# Patient Record
Sex: Female | Born: 1995 | Race: White | Hispanic: Yes | Marital: Single | Smoking: Never smoker
Health system: Southern US, Community
[De-identification: ages and names within clinical notes are randomized; demographics above are authoritative.]

## PROBLEM LIST (undated history)

## (undated) DIAGNOSIS — G43909 Migraine, unspecified, not intractable, without status migrainosus: Secondary | ICD-10-CM

---

## 2013-11-19 ENCOUNTER — Encounter (HOSPITAL_COMMUNITY): Payer: Self-pay | Admitting: Emergency Medicine

## 2013-11-19 ENCOUNTER — Emergency Department (HOSPITAL_COMMUNITY): Payer: BC Managed Care – PPO

## 2013-11-19 ENCOUNTER — Emergency Department (HOSPITAL_COMMUNITY)
Admission: EM | Admit: 2013-11-19 | Discharge: 2013-11-19 | Disposition: A | Discharge status: Home / Self Care | Payer: BC Managed Care – PPO | Attending: Emergency Medicine | Admitting: Emergency Medicine

## 2013-11-19 DIAGNOSIS — Y9239 Other specified sports and athletic area as the place of occurrence of the external cause: Secondary | ICD-10-CM | POA: Insufficient documentation

## 2013-11-19 DIAGNOSIS — S0993XA Unspecified injury of face, initial encounter: Secondary | ICD-10-CM | POA: Insufficient documentation

## 2013-11-19 DIAGNOSIS — Y9352 Activity, horseback riding: Secondary | ICD-10-CM | POA: Insufficient documentation

## 2013-11-19 DIAGNOSIS — R110 Nausea: Secondary | ICD-10-CM | POA: Insufficient documentation

## 2013-11-19 DIAGNOSIS — S060X0A Concussion without loss of consciousness, initial encounter: Secondary | ICD-10-CM | POA: Insufficient documentation

## 2013-11-19 DIAGNOSIS — S199XXA Unspecified injury of neck, initial encounter: Secondary | ICD-10-CM

## 2013-11-19 DIAGNOSIS — Z8679 Personal history of other diseases of the circulatory system: Secondary | ICD-10-CM | POA: Insufficient documentation

## 2013-11-19 DIAGNOSIS — Y92838 Other recreation area as the place of occurrence of the external cause: Secondary | ICD-10-CM

## 2013-11-19 HISTORY — DX: Migraine, unspecified, not intractable, without status migrainosus: G43.909

## 2013-11-19 MED ORDER — ONDANSETRON HCL 8 MG PO TABS: 8.0000 mg | ORAL_TABLET | Freq: Three times a day (TID) | ORAL | Status: AC | PRN

## 2013-11-19 NOTE — ED Notes (Signed)
Bed: [id number] Expected date:  Expected time:  Means of arrival:  Comments: Save for hall pt.

## 2013-11-19 NOTE — ED Notes (Signed)
Pt alert and oriented upon discharge. Female friend at the bedside. Pt laughing and joking thourgh out discharge instructions. PT verbalizes need for follow up with Neurology referrals that were provided to the patient.

## 2013-11-19 NOTE — ED Notes (Signed)
Pt was very tearful in triage.  This Clinical research associatewriter tried to reassure patient that we would be taken care of.  Pt responded, "Don't tell me that.  You don't fucking understand."  When bringing patient back to a bed, I explained that she needed to start in a hall bed to be moved to a room when someone was discharged.  Pt became very angry with this Clinical research associatewriter and said that she was going to go to another hospital.  I explained that if she left here, she would likely be waiting hours to get back to a bed.  Pt stated "I don't fucking care.  I'm leaving".  I told the patient that she could do as she pleased but I suggest she stay and be seen.  Pt responded, "Go do your fucking job".

## 2013-11-19 NOTE — Discharge Instructions (Signed)
Concussion Rest in a darkened area tonight. You should not return to any activity where there is a possibility that you might hit your head until cleared by the neurologist. Call tomorrow schedule office appointment A concussion is a brain injury. It is caused by:  A hit to the head.  A quick and sudden movement (jolt) of the head or neck. A concussion is usually not life-threatening. Even so, it can cause serious problems. If you had a concussion before, you may have concussion-like problems after a hit to your head. HOME CARE General Instructions  Follow your doctor's directions carefully.  Take medicines only as told by your doctor.  Only take medicines your doctor says are safe.  Do not drink alcohol until your doctor says it is okay. Alcohol and some drugs can slow down healing. They can also put you at risk for further injury.  If you are having trouble remembering things, write them down.  Try to do one thing at a time if you get distracted easily. For example, do not watch TV while making dinner.  Talk to your family members or close friends when making important decisions.  Follow up with your doctor as told.  Watch your symptoms. Tell others to do the same. Serious problems can sometimes happen after a concussion. Older adults are more likely to have these problems.  Tell your teachers, school nurse, school counselor, coach, Event organiser, or work Production designer, theatre/television/film about your concussion. Tell them about what you can or cannot do. They should watch to see if:  It gets even harder for you to pay attention or concentrate.  It gets even harder for you to remember things or learn new things.  You need more time than normal to finish things.  You become annoyed (irritable) more than before.  You are not able to deal with stress as well.  You have more problems than before.  Rest. Make sure you:  Get plenty of sleep at night.  Go to sleep early.  Go to bed at the same  time every day. Try to wake up at the same time.  Rest during the day.  Take naps when you feel tired.  Limit activities where you have to think a lot or concentrate. These include:  Doing homework.  Doing work related to a job.  Watching TV.  Using the computer. Returning To Your Regular Activities Return to your normal activities slowly, not all at once. You must give your body and brain enough time to heal.   Do not play sports or do other athletic activities until your doctor says it is okay.  Ask your doctor when you can drive, ride a bicycle, or work other vehicles or machines. Never do these things if you feel dizzy.  Ask your doctor about when you can return to work or school. Preventing Another Concussion It is very important to avoid another brain injury, especially before you have healed. In rare cases, another injury can lead to permanent brain damage, brain swelling, or death. The risk of this is greatest during the first 7-10 days after your injury. Avoid injuries by:   Wearing a seat belt when riding in a car.  Not drinking too much alcohol.  Avoiding activities that could lead to a second concussion (such as contact sports).  Wearing a helmet when doing activities like:  Biking.  Skiing.  Skateboarding.  Skating.  Making your home safer by:  Removing things from the floor or stairways that could  make you trip.  Using grab bars in bathrooms and handrails by stairs.  Placing non-slip mats on floors and in bathtubs.  Improve lighting in dark areas. GET HELP IF:  It gets even harder for you to pay attention or concentrate.  It gets even harder for you to remember things or learn new things.  You need more time than normal to finish things.  You become annoyed (irritable) more than before.  You are not able to deal with stress as well.  You have more problems than before.  You have problems keeping your balance.  You are not able to react  quickly when you should. Get help if you have any of these problems for more than 2 weeks:   Lasting (chronic) headaches.  Dizziness or trouble balancing.  Feeling sick to your stomach (nausea).  Seeing (vision) problems.  Being affected by noises or light more than normal.  Feeling sad, low, down in the dumps, blue, gloomy, or empty (depressed).  Mood changes (mood swings).  Feeling of fear or nervousness about what may happen (anxiety).  Feeling annoyed.  Memory problems.  Problems concentrating or paying attention.  Sleep problems.  Feeling tired all the time. GET HELP RIGHT AWAY IF:   You have bad headaches or your headaches get worse.  You have weakness (even if it is in one hand, leg, or part of the face).  You have loss of feeling (numbness).  You feel off balance.  You keep throwing up (vomiting).  You feel tired.  One black center of your eye (pupil) is larger than the other.  You twitch or shake violently (convulse).  Your speech is not clear (slurred).  You are more confused, easily angered (agitated), or annoyed than before.  You have more trouble resting than before.  You are unable to recognize people or places.  You have neck pain.  It is difficult to wake you up.  You have unusual behavior changes.  You pass out (lose consciousness). MAKE SURE YOU:   Understand these instructions.  Will watch your condition.  Will get help right away if you are not doing well or get worse. Document Released: 04/15/2009 Document Revised: 05/02/2013 Document Reviewed: 11/17/2012 [facility] Patient Information 2015 [place], [place]. This information is not intended to replace advice given to you by your health care provider. Make sure you discuss any questions you have with your health care provider.

## 2013-11-19 NOTE — ED Provider Notes (Signed)
CSN: [id number]     Arrival date & time 11/19/13  1456 History   First MD Initiated Contact with Patient 11/19/13 1531     Chief Complaint  Patient presents with  . Fall from horse      (Consider location/radiation/quality/duration/timing/severity/associated sxs/prior Treatment) HPI Patient fell from a horse during competition 1 PM today striking her head. She was wearing a helmet. She complains of headache, nausea and neck pain since the event pain and nausea are moderate. Diffuse. Neck pain is mild. Nothing makes symptoms better or worse. No treatment prior to coming here. No other associated injuries. Patient ambulatory since the Past Medical History  Diagnosis Date  . 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 No pertinent past surgical history. History reviewed. No pertinent family history. History  Substance Use Topics  . Smoking status: Never Smoker   . Smokeless tobacco: Not on file  . Alcohol Use: No   OB History   Grav Para Term Preterm Abortions TAB SAB Ect Mult Living                 Review of Systems  Gastrointestinal: Positive for nausea.  Musculoskeletal: Positive for neck pain.  Neurological: Positive for headaches.      Allergies  Review of patient's allergies indicates no known allergies.  Home Medications   Prior to Admission medications   Not on File   BP 120/80  Pulse 83  Temp(Src) 98.3 F (36.8 C) (Oral)  Resp 20  SpO2 100% Physical Exam  Nursing note and vitals reviewed. Constitutional: She is oriented to person, place, and time. She appears well-developed and well-nourished.  HENT:  Head: Normocephalic and atraumatic.  Right Ear: External ear normal.  Left Ear: External ear normal.  Bilateral tympanic membranes normal  Eyes: Conjunctivae are normal. Pupils are equal, round, and reactive to light.  Neck: Neck supple. No tracheal deviation present. No thyromegaly present.  Nontender  Cardiovascular:  Normal rate and regular rhythm.   No murmur heard. Pulmonary/Chest: Effort normal and breath sounds normal.  Abdominal: Soft. Bowel sounds are normal. She exhibits no distension. There is no tenderness.  Musculoskeletal: Normal range of motion. She exhibits no edema and no tenderness.  Neurological: She is alert and oriented to person, place, and time. She has normal reflexes. Coordination normal.  DTRs symmetric bilaterally knee jerk ankle jerk biceps was ordered bilaterally finger to nose normal gait normal Romberg normal prior drift normal  Skin: Skin is warm and dry. No rash noted.  Psychiatric: She has a normal mood and affect.    ED Course  Procedures (including critical care time) Labs Review Labs Reviewed - No data to display  Imaging Review No results found.   EKG Interpretation None     Declines pain medicine or antiemetics . 4:40 PM patient is alert Glasgow Coma Score 15. No distress No results found for this or any previous visit. Ct Head Wo Contrast  11/19/2013   CLINICAL DATA:  Loss of consciousness.  Fall from a horse.  EXAM: CT HEAD WITHOUT CONTRAST  TECHNIQUE: Contiguous axial images were obtained from the base of the skull through the vertex without intravenous contrast.  COMPARISON:  None.  FINDINGS: The brain appears normal without infarct, hemorrhage, mass lesion, mass effect, midline shift or abnormal extra-axial fluid collection. No hydrocephalus or pneumocephalus. The calvarium is intact. Imaged paranasal sinuses and mastoid air cells are clear.  IMPRESSION: Normal head CT scan.   Electronically Signed  By: Drusilla Kannerhomas  Dalessio M.D.   On: 11/19/2013 16:31   No results found for this or any previous visit. Ct Head Wo Contrast  11/19/2013   CLINICAL DATA:  Loss of consciousness.  Fall from a horse.  EXAM: CT HEAD WITHOUT CONTRAST  TECHNIQUE: Contiguous axial images were obtained from the base of the skull through the vertex without intravenous contrast.  COMPARISON:   None.  FINDINGS: The brain appears normal without infarct, hemorrhage, mass lesion, mass effect, midline shift or abnormal extra-axial fluid collection. No hydrocephalus or pneumocephalus. The calvarium is intact. Imaged paranasal sinuses and mastoid air cells are clear.  IMPRESSION: Normal head CT scan.   Electronically Signed   By: Drusilla Kannerhomas  Dalessio M.D.   On: 11/19/2013 16:31    MDM  Cervical spine cleared by Nexus criteria Final diagnoses:  None   plan prescription Zofran. Neurology referral. Tylenol for pain. Patient advised to avoid possibility of head trauma until symptoms clear Diagnosis minor head trauma with concussion       Doug SouSam Dezirea Mccollister, MD 11/19/13 1647

## 2013-11-19 NOTE — ED Notes (Signed)
Bed: [facility] Expected date:  Expected time:  Means of arrival:  Comments: 

## 2013-11-19 NOTE — ED Notes (Signed)
Pt states that she was in a horse jumping competition and her horse ran off to the right and she fell off.  Pt c/o head pain and pain in the back of her neck.  Pt was ambulatory to triage.  Denies LOC.  Pt feels nauseated.

## 2014-12-24 IMAGING — CT CT HEAD W/O CM
2 series · 17 of 30 positions shown, 20 images · non-contrast
Comparison: None.

CLINICAL DATA: Loss of consciousness.  Fall from a horse.

EXAM:
CT HEAD WITHOUT CONTRAST
TECHNIQUE: Contiguous axial images were obtained from the base of the skull
through the vertex without intravenous contrast.

[Series 2: head w/o · axial · non-contrast · 0.43mm/px · z∈[-90,+30]mm · 9 of 30 slices shown, 12 images]
[im 3/30  brain]
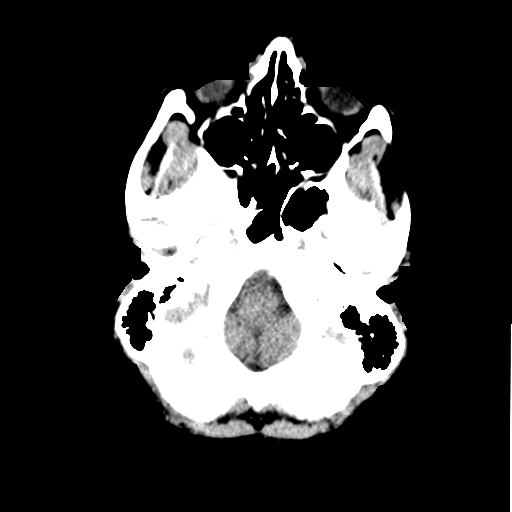
[im 3/30  bone]
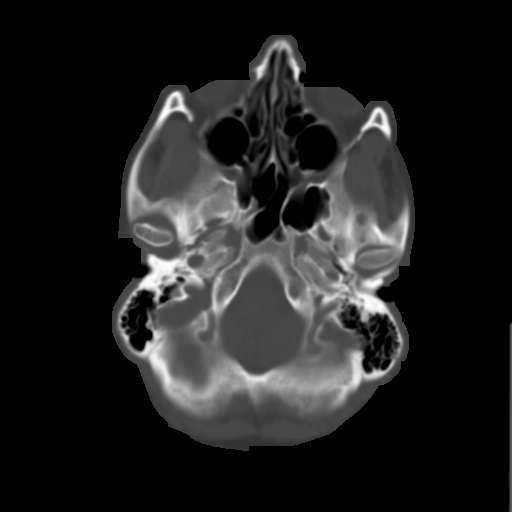
[im 6/30  brain]
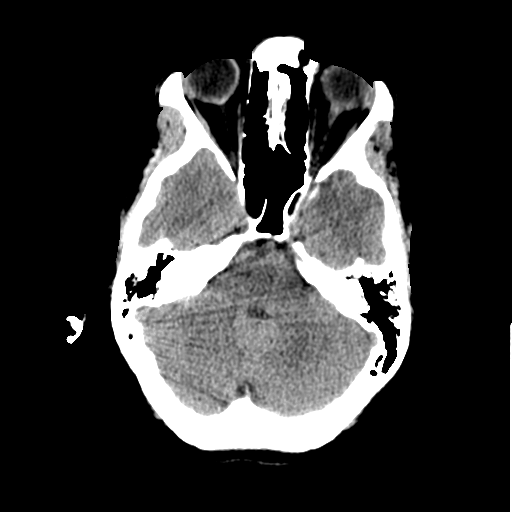
[im 9/30  brain]
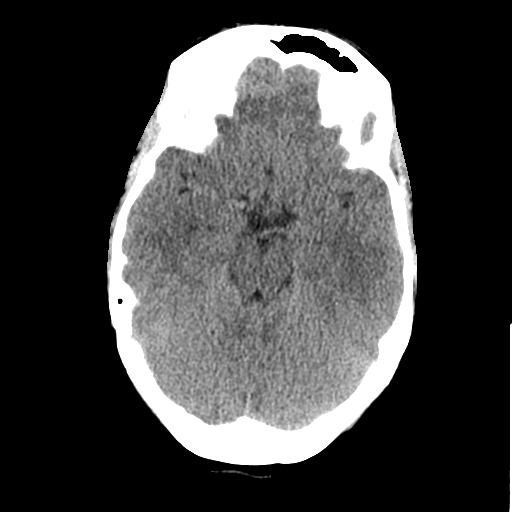
[im 12/30  brain]
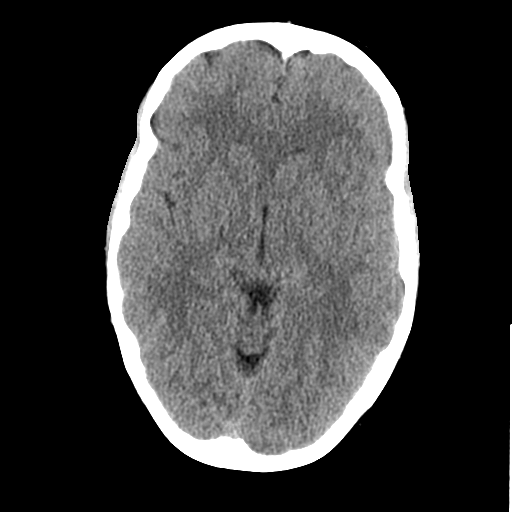
[im 15/30  brain]
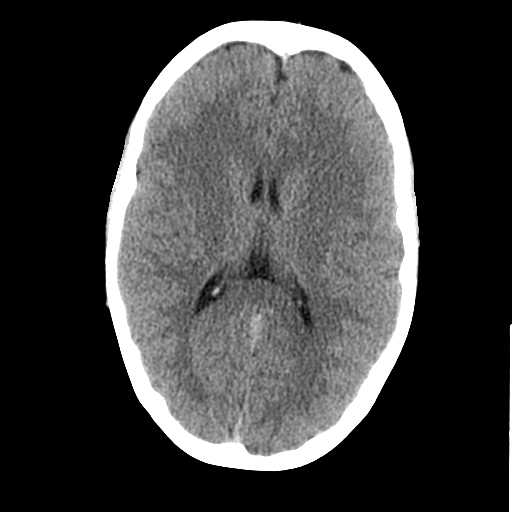
[im 15/30  bone]
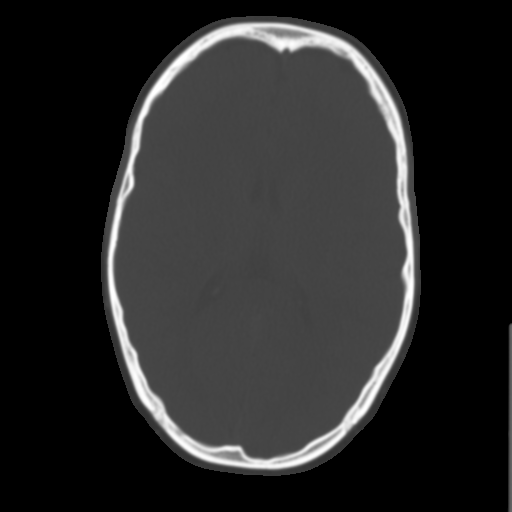
[im 18/30  brain]
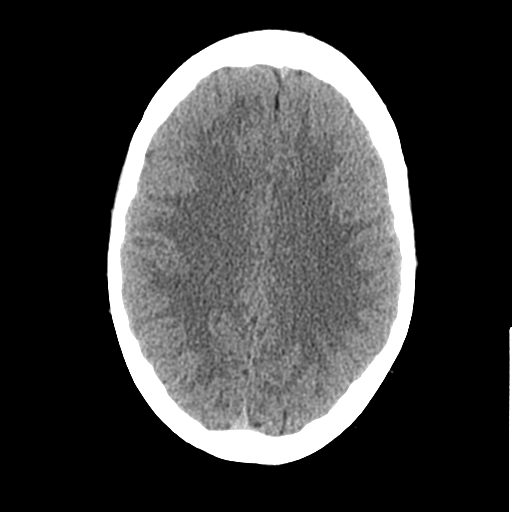
[im 21/30  brain]
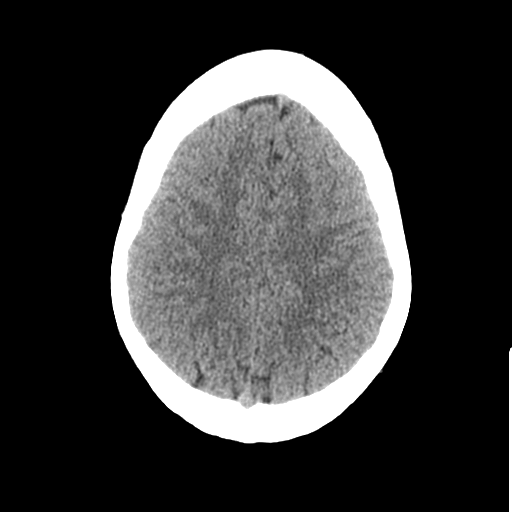
[im 24/30  brain]
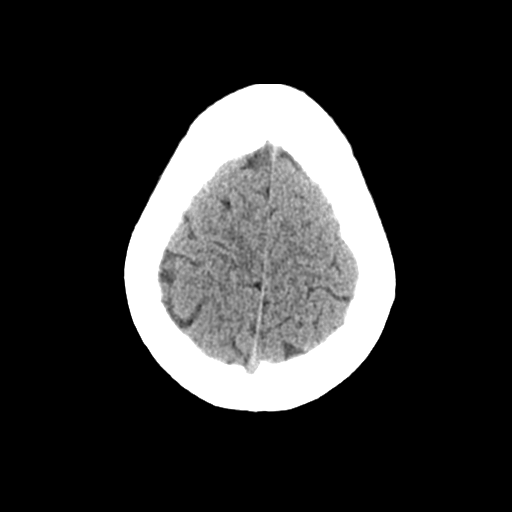
[im 27/30  brain]
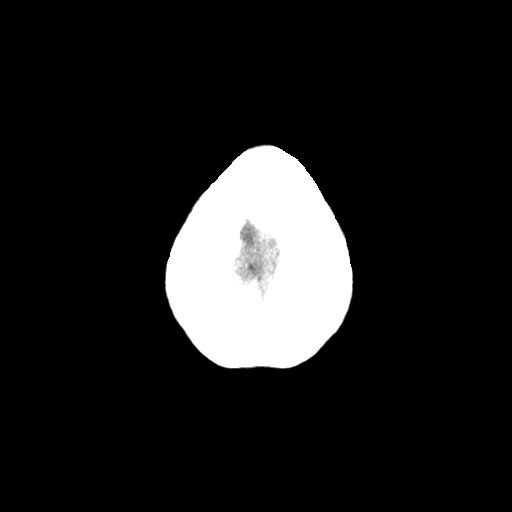
[im 27/30  bone]
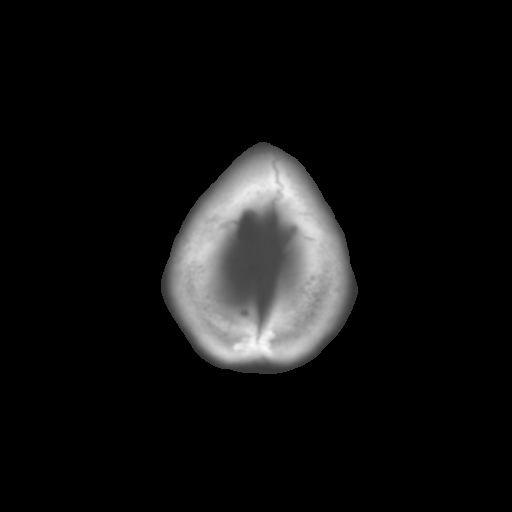

[Series 3: bone windows · axial · 0.43mm/px · z∈[-85,+29]mm · 8 of 50 slices shown]
[im 6/50  bone]
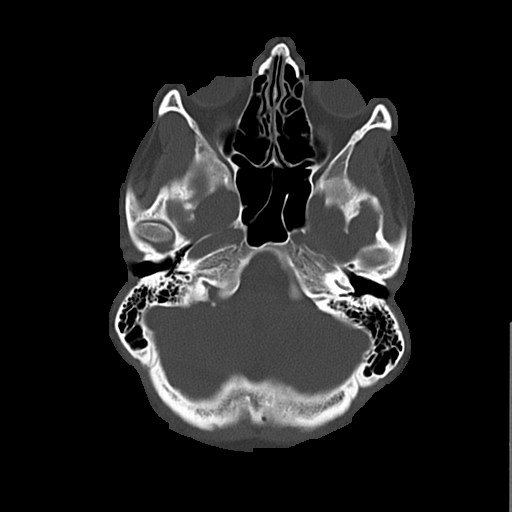
[im 11/50  bone]
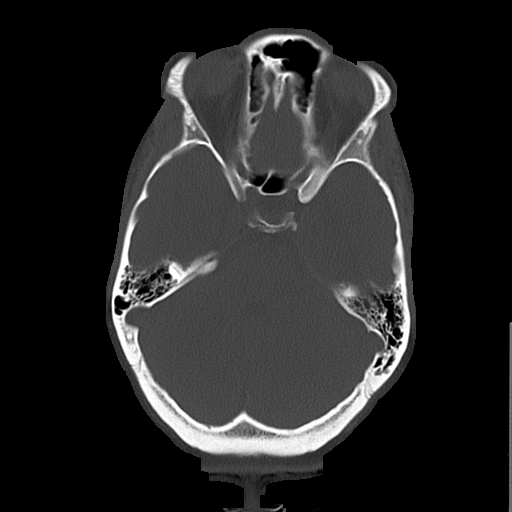
[im 17/50  bone]
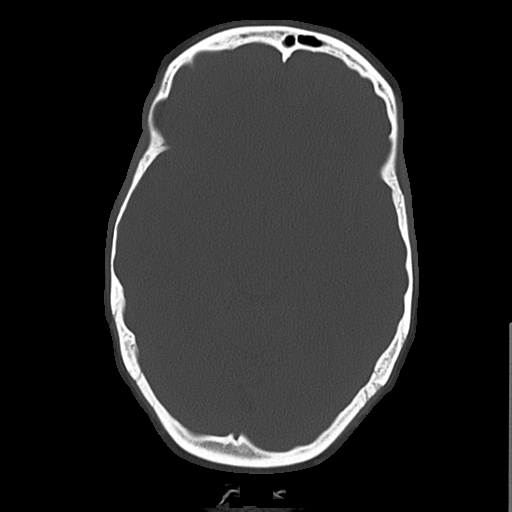
[im 22/50  bone]
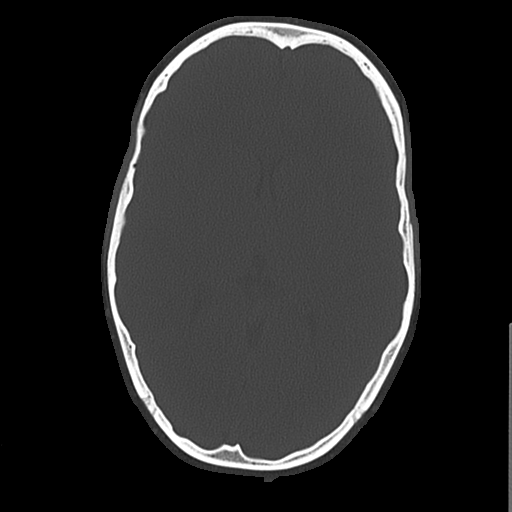
[im 28/50  bone]
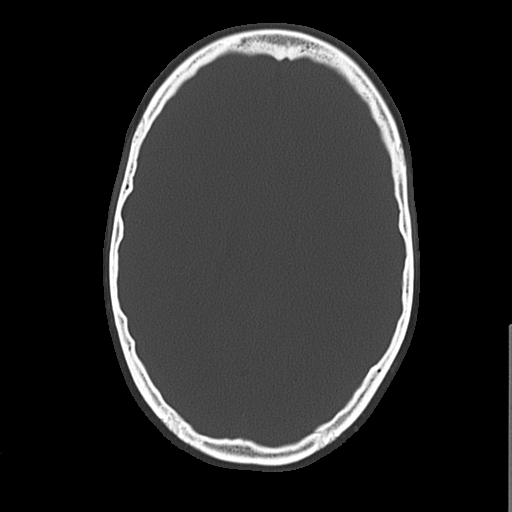
[im 33/50  bone]
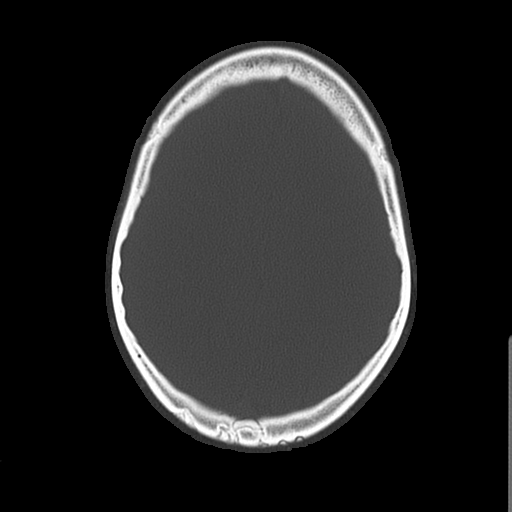
[im 39/50  bone]
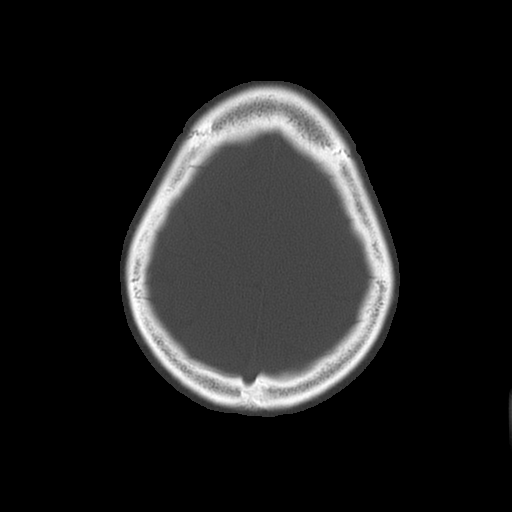
[im 44/50  bone]
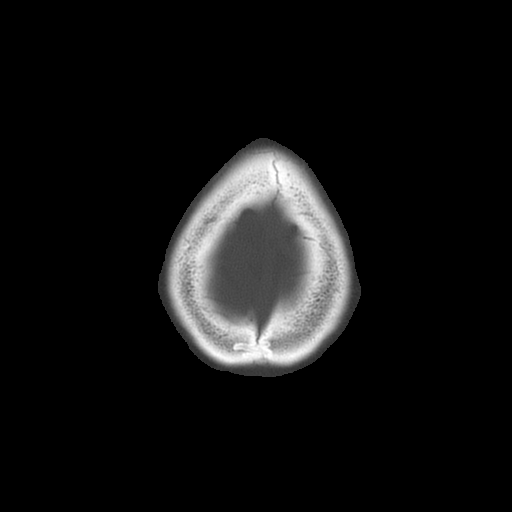

[17 of 30 positions shown; findings below may reference images not displayed]

FINDINGS: The brain appears normal without infarct, hemorrhage, mass lesion,
mass effect, midline shift or abnormal extra-axial fluid collection.
No hydrocephalus or pneumocephalus. The calvarium is intact. Imaged
paranasal sinuses and mastoid air cells are clear.
IMPRESSION: Normal head CT scan.
